# Patient Record
Sex: Female | Born: 1985 | Hispanic: No | Marital: Married | Smoking: Never smoker
Health system: Southern US, Community
[De-identification: ages and names within clinical notes are randomized; demographics above are authoritative.]

## PROBLEM LIST (undated history)

## (undated) DIAGNOSIS — D259 Leiomyoma of uterus, unspecified: Secondary | ICD-10-CM

## (undated) HISTORY — PX: UTERINE FIBROID SURGERY: SHX826

---

## 2014-04-22 ENCOUNTER — Other Ambulatory Visit: Payer: Self-pay | Admitting: Obstetrics and Gynecology

## 2014-04-22 DIAGNOSIS — N6320 Unspecified lump in the left breast, unspecified quadrant: Secondary | ICD-10-CM

## 2014-04-26 ENCOUNTER — Encounter (INDEPENDENT_AMBULATORY_CARE_PROVIDER_SITE_OTHER): Payer: Self-pay

## 2014-04-26 ENCOUNTER — Other Ambulatory Visit: Payer: Self-pay | Admitting: Obstetrics and Gynecology

## 2014-04-26 ENCOUNTER — Ambulatory Visit
Admission: RE | Admit: 2014-04-26 | Discharge: 2014-04-26 | Disposition: A | Discharge status: Home / Self Care | Payer: BC Managed Care – PPO | Source: Ambulatory Visit | Attending: Obstetrics and Gynecology | Admitting: Obstetrics and Gynecology

## 2014-04-26 DIAGNOSIS — N6320 Unspecified lump in the left breast, unspecified quadrant: Secondary | ICD-10-CM

## 2014-05-01 ENCOUNTER — Other Ambulatory Visit: Payer: Self-pay | Admitting: Obstetrics and Gynecology

## 2014-05-01 ENCOUNTER — Ambulatory Visit
Admission: RE | Admit: 2014-05-01 | Discharge: 2014-05-01 | Disposition: A | Discharge status: Home / Self Care | Payer: BC Managed Care – PPO | Source: Ambulatory Visit | Attending: Obstetrics and Gynecology | Admitting: Obstetrics and Gynecology

## 2014-05-01 DIAGNOSIS — N6320 Unspecified lump in the left breast, unspecified quadrant: Secondary | ICD-10-CM

## 2015-06-12 ENCOUNTER — Inpatient Hospital Stay (HOSPITAL_COMMUNITY): Payer: BLUE CROSS/BLUE SHIELD

## 2015-06-12 ENCOUNTER — Encounter (HOSPITAL_COMMUNITY): Payer: Self-pay | Admitting: *Deleted

## 2015-06-12 ENCOUNTER — Observation Stay (HOSPITAL_COMMUNITY)
Admission: AD | Admit: 2015-06-12 | Discharge: 2015-06-13 | Disposition: A | Discharge status: Home / Self Care | Payer: BLUE CROSS/BLUE SHIELD | Source: Ambulatory Visit | Attending: Obstetrics and Gynecology | Admitting: Obstetrics and Gynecology

## 2015-06-12 DIAGNOSIS — R109 Unspecified abdominal pain: Principal | ICD-10-CM | POA: Insufficient documentation

## 2015-06-12 DIAGNOSIS — R0602 Shortness of breath: Secondary | ICD-10-CM | POA: Diagnosis not present

## 2015-06-12 DIAGNOSIS — R110 Nausea: Secondary | ICD-10-CM | POA: Insufficient documentation

## 2015-06-12 DIAGNOSIS — G8918 Other acute postprocedural pain: Secondary | ICD-10-CM | POA: Diagnosis not present

## 2015-06-12 HISTORY — DX: Leiomyoma of uterus, unspecified: D25.9

## 2015-06-12 LAB — CBC WITH DIFFERENTIAL/PLATELET
Basophils Absolute: 0 10*3/uL (ref 0.0–0.1)
Basophils Relative: 0 % (ref 0–1)
EOS ABS: 0 10*3/uL (ref 0.0–0.7)
EOS PCT: 0 % (ref 0–5)
HCT: 37.9 % (ref 36.0–46.0)
HEMOGLOBIN: 12.5 g/dL (ref 12.0–15.0)
Lymphocytes Relative: 7 % — ABNORMAL LOW (ref 12–46)
Lymphs Abs: 0.5 10*3/uL — ABNORMAL LOW (ref 0.7–4.0)
MCH: 30 pg (ref 26.0–34.0)
MCHC: 33 g/dL (ref 30.0–36.0)
MCV: 91.1 fL (ref 78.0–100.0)
MONO ABS: 0.3 10*3/uL (ref 0.1–1.0)
MONOS PCT: 4 % (ref 3–12)
Neutro Abs: 6.7 10*3/uL (ref 1.7–7.7)
Neutrophils Relative %: 89 % — ABNORMAL HIGH (ref 43–77)
Platelets: 228 10*3/uL (ref 150–400)
RBC: 4.16 MIL/uL (ref 3.87–5.11)
RDW: 13.5 % (ref 11.5–15.5)
WBC: 7.5 10*3/uL (ref 4.0–10.5)

## 2015-06-12 LAB — BASIC METABOLIC PANEL
Anion gap: 4 — ABNORMAL LOW (ref 5–15)
BUN: 5 mg/dL — AB (ref 6–20)
CALCIUM: 8.6 mg/dL — AB (ref 8.9–10.3)
CO2: 23 mmol/L (ref 22–32)
Chloride: 101 mmol/L (ref 101–111)
Creatinine, Ser: 0.69 mg/dL (ref 0.44–1.00)
GFR calc Af Amer: >60 mL/min (ref >60)
GFR calc non Af Amer: >60 mL/min (ref >60)
Glucose, Bld: 96 mg/dL (ref 65–99)
Potassium: 4.1 mmol/L (ref 3.5–5.1)
SODIUM: 128 mmol/L — AB (ref 135–145)

## 2015-06-12 MED ORDER — SODIUM CHLORIDE 0.45 % IV SOLN
INTRAVENOUS | Status: DC
  Administered 2015-06-13 (×3): via INTRAVENOUS
  Filled 2015-06-12 (×4): qty 1000

## 2015-06-12 MED ORDER — POTASSIUM CHLORIDE IN NACL 20-0.45 MEQ/L-% IV SOLN: INTRAVENOUS | Status: DC

## 2015-06-12 MED ORDER — MORPHINE SULFATE 4 MG/ML IJ SOLN
2.0000 mg | Freq: Once | INTRAMUSCULAR | Status: AC
  Administered 2015-06-12: 2 mg via INTRAVENOUS
  Filled 2015-06-12: qty 1

## 2015-06-12 MED ORDER — SODIUM CHLORIDE 0.9 % IV SOLN
INTRAVENOUS | Status: DC
Start: 2015-06-12 — End: 2015-06-14
  Administered 2015-06-12: 22:00:00 via INTRAVENOUS

## 2015-06-12 MED ORDER — PROMETHAZINE HCL 25 MG/ML IJ SOLN
25.0000 mg | Freq: Once | INTRAMUSCULAR | Status: AC
  Administered 2015-06-12: 25 mg via INTRAVENOUS
  Filled 2015-06-12: qty 1

## 2015-06-12 MED ORDER — OXYCODONE-ACETAMINOPHEN 5-325 MG PO TABS: 1.0000 | ORAL_TABLET | Freq: Once | ORAL | Status: DC

## 2015-06-12 MED ORDER — PRENATAL MULTIVITAMIN CH
1.0000 | ORAL_TABLET | Freq: Every day | ORAL | Status: DC
  Administered 2015-06-13: 1 via ORAL
  Filled 2015-06-12: qty 1

## 2015-06-12 MED ORDER — MORPHINE SULFATE 4 MG/ML IJ SOLN
1.0000 mg | INTRAMUSCULAR | Status: DC | PRN
  Administered 2015-06-13: 2 mg via INTRAVENOUS
  Filled 2015-06-12: qty 1

## 2015-06-12 MED ORDER — FLEET ENEMA 7-19 GM/118ML RE ENEM
1.0000 | ENEMA | Freq: Once | RECTAL | Status: AC
  Administered 2015-06-12: 1 via RECTAL

## 2015-06-12 MED ORDER — PROMETHAZINE HCL 25 MG/ML IJ SOLN: 25.0000 mg | Freq: Four times a day (QID) | INTRAMUSCULAR | Status: DC | PRN

## 2015-06-12 NOTE — MAU Note (Signed)
Day before yesterday had laparoscopy. Yesterday had bloating, pain up into shoulder.  This morning had vomiting, was very painful when she threw up.  Feels SOB.  (closed mouth breathing noted).  Not passing any air.(gas)  Office had instructed them to get Gas-x, helped initally.

## 2015-06-12 NOTE — MAU Provider Note (Signed)
History     CSN: [id number]  Arrival date and time: 06/12/15 1656   First Provider Initiated Contact with Patient 06/12/15 1758      No chief complaint on file.  HPI  Erin Finley 29 y.o. nonpregnant female presents to MAU complaining of abdominal pain, shoulder pain, shortness of breath.  She had laparoscopic surgery 2 days ago.  Since then she has been unable to poop or pass gas.  She is very uncomfortable.  The abdominal pain has gone higher and higher.  She is now unable to take deep breaths without pain.  The pain is dull and severe all throughout abdomen.  It has worsened over last 2 days.  It is constant.  It is worsened by movement.  She has been unable to eat anything today and is feeling very hungry and weak.  No alleviating factors.  No temporal associations.  Denies dysuria, is able to urinate.  Denies fever.    OB History    No data available      Past Medical History  Diagnosis Date  . Fibroid, uterine     Past Surgical History  Procedure Laterality Date  . Uterine fibroid surgery      No family history on file.  History  Substance Use Topics  . Smoking status: Never Smoker   . Smokeless tobacco: Not on file  . Alcohol Use: No    Allergies: No Known Allergies  Prescriptions prior to admission  Medication Sig Dispense Refill Last Dose  . docusate sodium (COLACE) 100 MG capsule Take 100 mg by mouth 2 (two) times daily.   06/12/2015 at Unknown time  . ferrous sulfate 325 (65 FE) MG tablet Take 325 mg by mouth 2 (two) times daily.   Past Week at Unknown time  . Multiple Vitamin (MULTIVITAMIN WITH MINERALS) TABS tablet Take 1 tablet by mouth daily.   Past Month at Unknown time  . ondansetron (ZOFRAN) 4 MG tablet Take 4 mg by mouth daily as needed for nausea.    06/12/2015 at Unknown time  . oxyCODONE-acetaminophen (PERCOCET) 7.5-325 MG per tablet Take 1 tablet by mouth every 4 (four) hours as needed for moderate pain.    06/12/2015 at 1330  . Simethicone (GAS-X PO)  Take 1 tablet by mouth 3 (three) times daily as needed (For bloating.).    06/12/2015 at Unknown time    ROS Pertinent ROS in HPI.  All other systems are negative.   Physical Exam   Blood pressure 129/72, pulse 93, temperature 98.1 F (36.7 C), temperature source Oral, resp. rate 28, last menstrual period 05/23/2015, SpO2 100 %.  Physical Exam  Constitutional: She is oriented to person, place, and time. She appears well-developed and well-nourished. She appears distressed.  HENT:  Head: Atraumatic.  Eyes: EOM are normal.  Neck: Normal range of motion.  Cardiovascular: Normal rate and regular rhythm.   Respiratory: Effort normal. No respiratory distress.  GI: She exhibits distension. There is tenderness.  Hypoactive bowel sounds throughout.  Significant tenderness to gentle palpation.  Musculoskeletal: Normal range of motion.  Neurological: She is alert and oriented to person, place, and time.  Skin: Skin is warm and dry.  Psychiatric: She has a normal mood and affect.    MAU Course  Procedures  MDM Erin Finley consulted.  He advises for CBC, CXR, enema.   CXR done and radiologist called to advise of free peritoneal fluid and suspected perf/possible obstruction/ileus.  Erin Finley called and informed of CXR results as  well as normal CBC. He advises for continuation of plan with enema and then call to inform of pt progress.   Report given and care turned over to Erin Finley, CNM.   Erin Noss, PA-C 2047; RN reports minimal results with enema  2128: D/W Erin Finley, will place in OBS   Assessment and Plan   Post operative pain Possible ileus Admit to observation NPO  Erin Finley  9:33 PM 06/12/2015    Erin Finley, Erin Finley 06/12/2015, 8:12 PM

## 2015-06-13 LAB — URINALYSIS, ROUTINE W REFLEX MICROSCOPIC
BILIRUBIN URINE: NEGATIVE
GLUCOSE, UA: NEGATIVE mg/dL
KETONES UR: 15 mg/dL — AB
Leukocytes, UA: NEGATIVE
Nitrite: NEGATIVE
PH: 5 (ref 5.0–8.0)
Protein, ur: NEGATIVE mg/dL
Specific Gravity, Urine: 1.015 (ref 1.005–1.030)
Urobilinogen, UA: 0.2 mg/dL (ref 0.0–1.0)

## 2015-06-13 LAB — CBC WITH DIFFERENTIAL/PLATELET
BASOS ABS: 0 10*3/uL (ref 0.0–0.1)
BASOS PCT: 0 % (ref 0–1)
EOS ABS: 0 10*3/uL (ref 0.0–0.7)
Eosinophils Relative: 0 % (ref 0–5)
HCT: 33.6 % — ABNORMAL LOW (ref 36.0–46.0)
Hemoglobin: 11.2 g/dL — ABNORMAL LOW (ref 12.0–15.0)
Lymphocytes Relative: 10 % — ABNORMAL LOW (ref 12–46)
Lymphs Abs: 0.6 10*3/uL — ABNORMAL LOW (ref 0.7–4.0)
MCH: 29.9 pg (ref 26.0–34.0)
MCHC: 33.3 g/dL (ref 30.0–36.0)
MCV: 89.6 fL (ref 78.0–100.0)
Monocytes Absolute: 0.3 10*3/uL (ref 0.1–1.0)
Monocytes Relative: 5 % (ref 3–12)
Neutro Abs: 5.3 10*3/uL (ref 1.7–7.7)
Neutrophils Relative %: 85 % — ABNORMAL HIGH (ref 43–77)
Platelets: 210 10*3/uL (ref 150–400)
RBC: 3.75 MIL/uL — ABNORMAL LOW (ref 3.87–5.11)
RDW: 13.4 % (ref 11.5–15.5)
WBC: 6.2 10*3/uL (ref 4.0–10.5)

## 2015-06-13 LAB — URINE MICROSCOPIC-ADD ON

## 2015-06-13 MED ORDER — OXYCODONE-ACETAMINOPHEN 7.5-325 MG PO TABS: 1.0000 | ORAL_TABLET | Freq: Four times a day (QID) | ORAL | Status: DC | PRN

## 2015-06-13 MED ORDER — OXYCODONE-ACETAMINOPHEN 7.5-325 MG PO TABS: 1.0000 | ORAL_TABLET | ORAL | Status: AC | PRN

## 2015-06-13 NOTE — Discharge Summary (Signed)
Physician Discharge Summary  Patient ID: Erin Finley MRN: [id number] DOB/AGE: [birth year] 31 y.o.  Admit date: 06/12/2015 Discharge date: 06/13/2015  Admission Diagnoses: Postoperative abdominal pain, status post laparoscopic robotic assisted myomectomy Discharge Diagnoses:  Postoperative abdominal pain, status post laparoscopic robotic assisted myomectomy, possible chemical peritonitis due to intraperitoneal instillation of Seprafilm Active Problems:   Post-operative pain   Discharged Condition: good  Hospital Course: Patient was admitted 2 days postoperative with a complaint of lower abdominal distention and gradual pain and nausea, starting 1 day postop after the uneventful laparoscopic robotic assisted 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 She remained afebrile and white blood cell count remained normal. Although the initial chest x-ray and abdominal series suggested anterior in the abdomen, this was attributed to residual air that was introduced through the supraumbilical extension of the laparoscope port. Patient passed gas and have bowel movements. She tolerated diet on the day of discharge.  Consults: None  Significant Diagnostic Studies: labs: CBC, BMP  Treatments: IV hydration  Discharge Exam: Blood pressure 110/58, pulse 74, temperature 99.2 F (37.3 C), temperature source Oral, resp. rate 24, height 5\' 3"  (1.6 m), weight 59.421 kg (131 lb), last menstrual period 05/23/2015, SpO2 100 %. General appearance: alert, cooperative and no distress  Lungs: Clear Heart: Regular rate and rhythm Abdomen: Soft, slightly tender around the laparoscopy incisions, no rebound tenderness. Bowel sounds present but hypoactive in all 4 quadrants Incisions: Dry and intact  Disposition: Home I will see her in my office in 5 days.    Medication List    ASK your doctor about these medications        docusate sodium 100 MG capsule  Commonly known as:   COLACE  Take 100 mg by mouth 2 (two) times daily.     ferrous sulfate 325 (65 FE) MG tablet  Take 325 mg by mouth 2 (two) times daily.     GAS-X PO  Take 1 tablet by mouth 3 (three) times daily as needed (For bloating.).     multivitamin with minerals Tabs tablet  Take 1 tablet by mouth daily.     PERCOCET 7.5-325 MG per tablet  Generic drug:  oxyCODONE-acetaminophen  Take 1 tablet by mouth every 4 (four) hours as needed for moderate pain.     ZOFRAN 4 MG tablet  Generic drug:  ondansetron  Take 4 mg by mouth daily as needed for nausea.         SignedGovernor Specking 06/13/2015, 7:53 PM

## 2015-06-13 NOTE — Progress Notes (Signed)
Discharge instructions and prescription given and reviewed with patient. Patient and husband expresses understanding. Pt taken in wheelchair and discharge home with husband.

## 2015-06-13 NOTE — Progress Notes (Signed)
Lap sites x 4 with steri strips on, no drainage or reddness noted at sites.

## 2016-07-11 IMAGING — CR DG CHEST 2V
2 series · 2 of 2 positions shown · non-contrast
Comparison: Supine and erect abdomen radiographs obtained at the
same time.

CLINICAL DATA: Severe abdominal pain, some chest pain, shortness of
breath. Status post laparoscopic surgery for fibroid removal on
06/10/2015.

EXAM:
CHEST  2 VIEW

[chest pa]
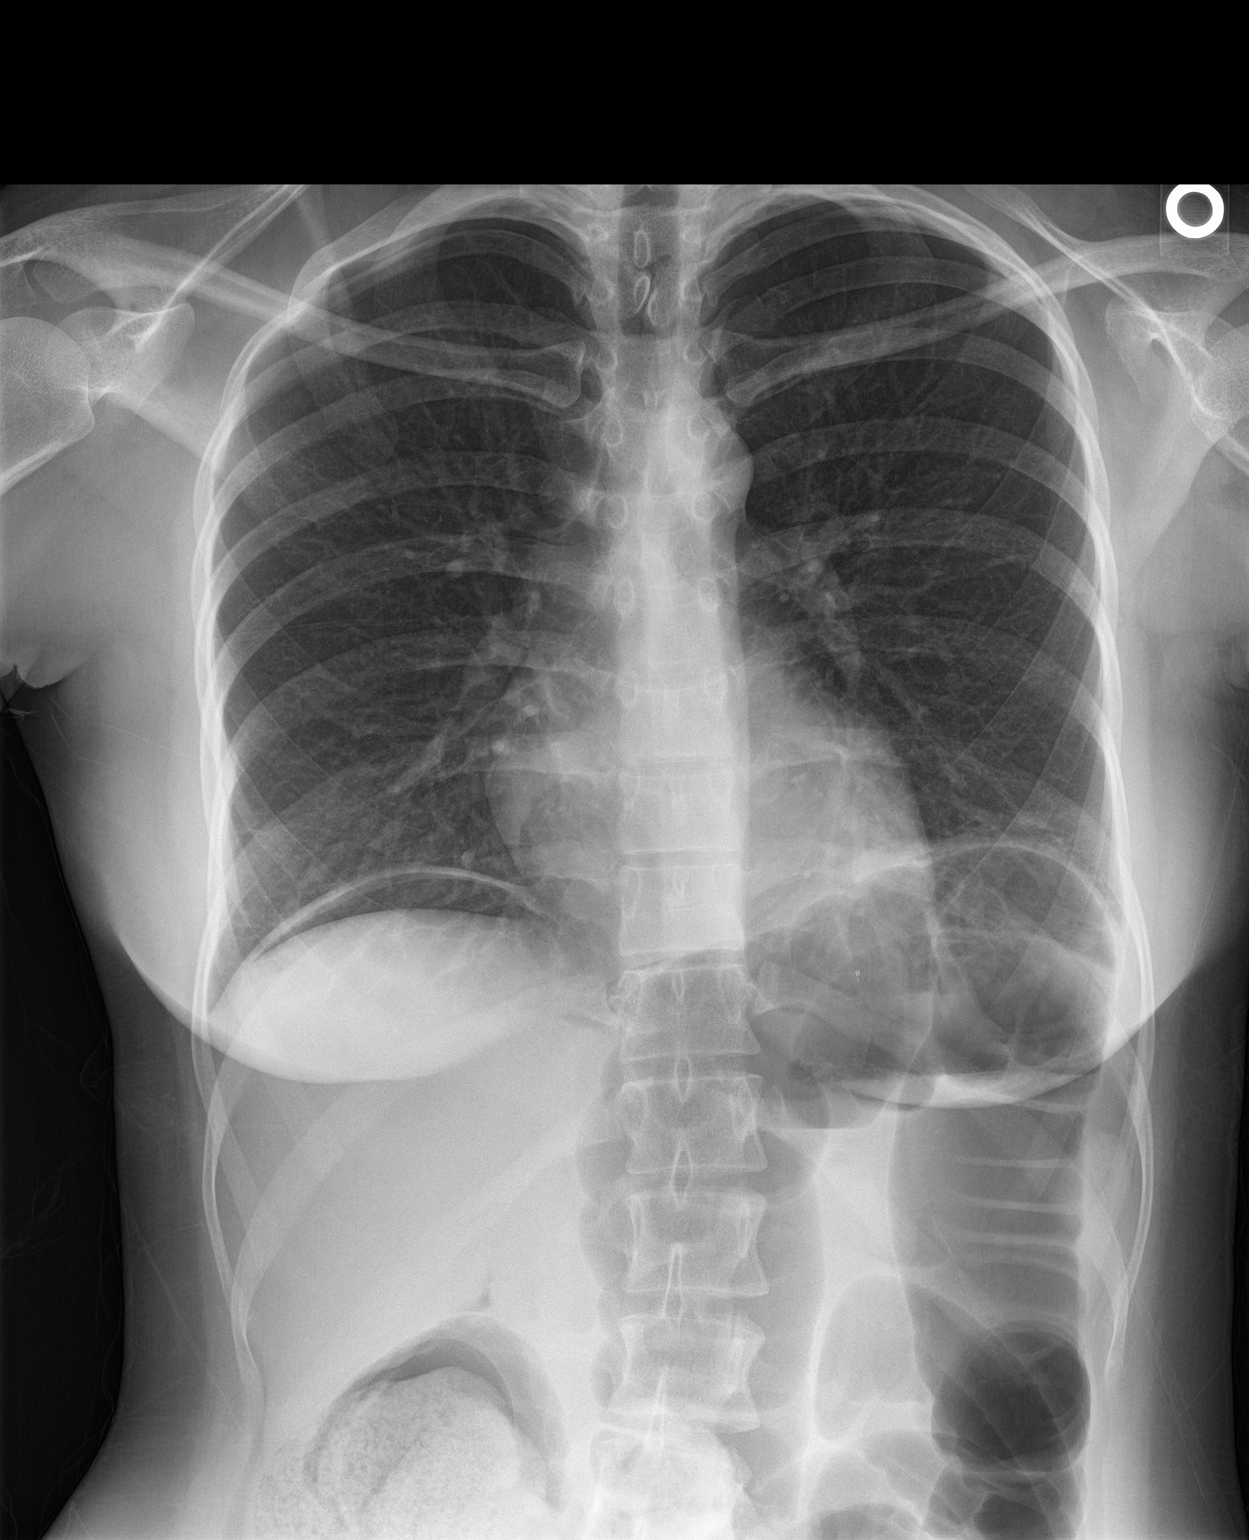

[chest lat]
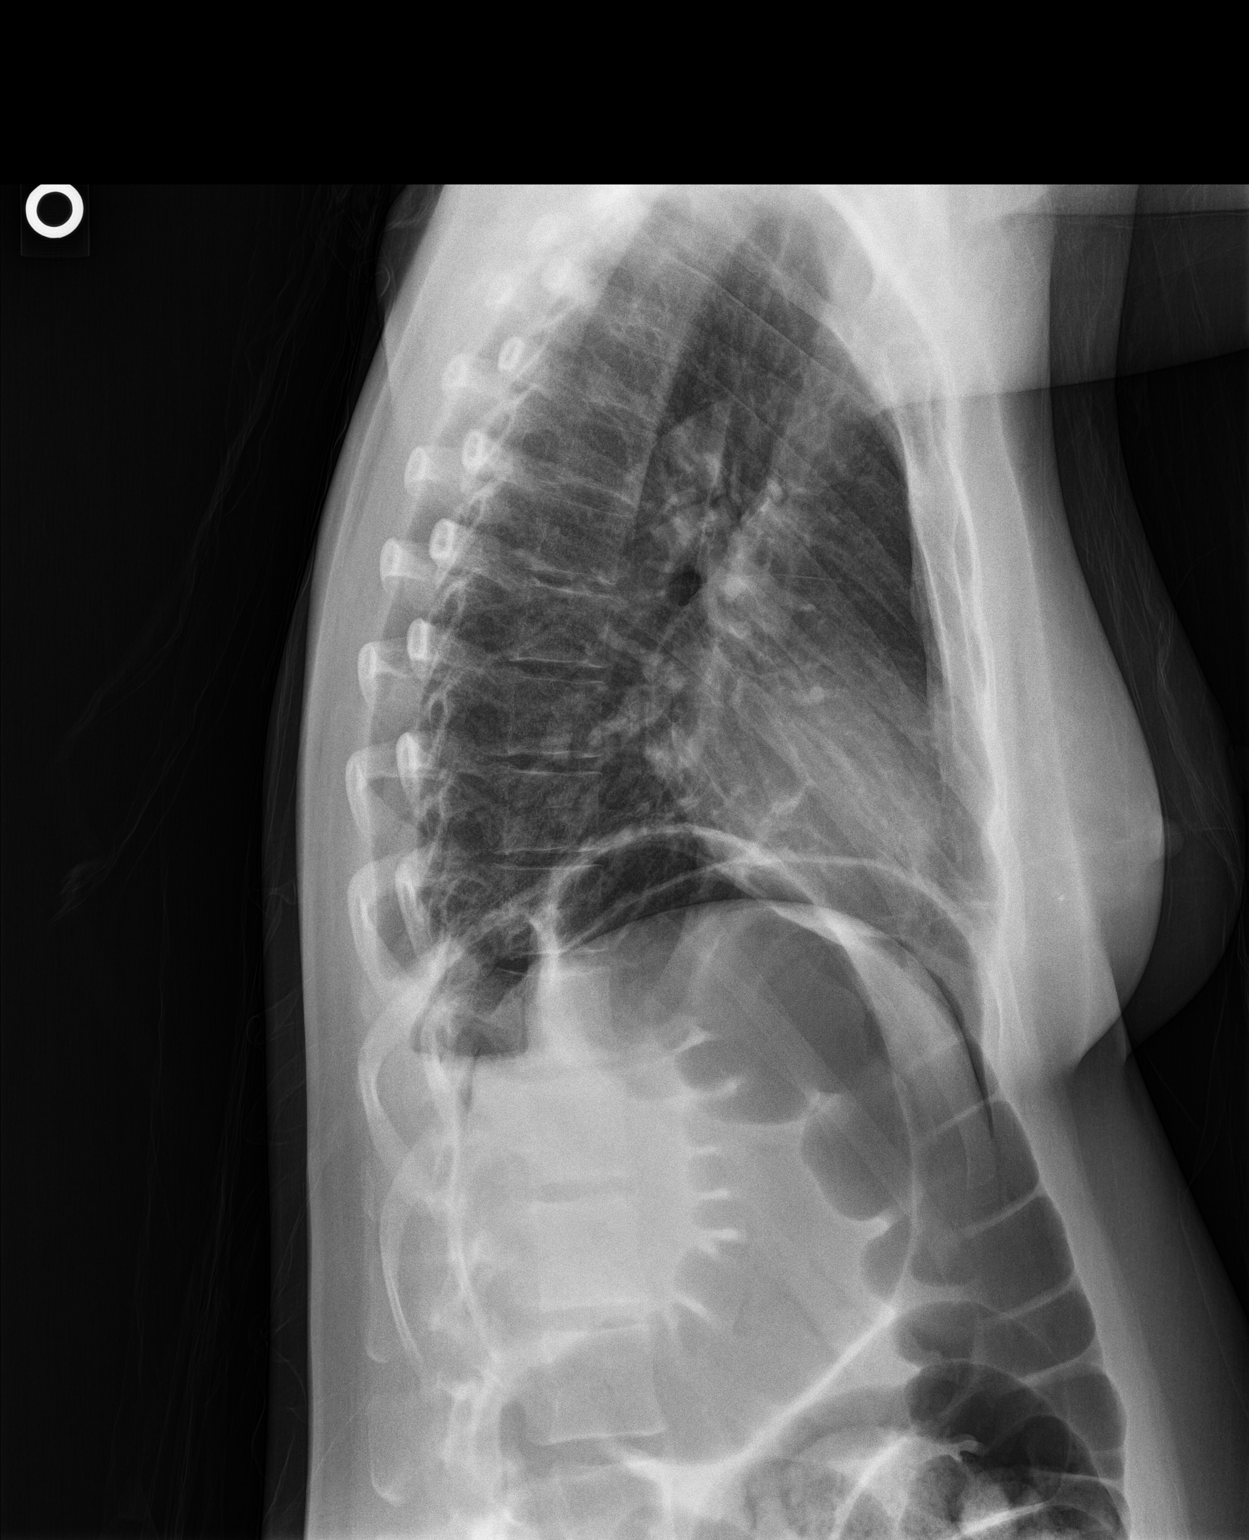

[2 of 2 positions shown; findings below may reference images not displayed]

FINDINGS: Free peritoneal air is demonstrated in the upper abdomen. There are
also mildly dilated loops of colon and small bowel with air-fluid
levels in the upper abdomen. Normal sized heart. Clear lungs. Mild
scoliosis.
IMPRESSION: 1. Free peritoneal air, compatible with bowel perforation. There
should not be any retained carbon dioxide 2 days following
laparoscopic surgery.
2. Mild small bowel and colonic ileus or obstruction.
Critical Value/emergent results were called by telephone at the time
of interpretation on 06/12/2015 at [DATE] to Dr. MANIVALD GUTMANN-VALME ,
who verbally acknowledged these results.
